# Patient Record
Sex: Male | Born: 1960 | Race: Asian | Hispanic: No | Marital: Married | Smoking: Never smoker
Health system: Southern US, Community
[De-identification: ages and names within clinical notes are randomized; demographics above are authoritative.]

## PROBLEM LIST (undated history)

## (undated) HISTORY — PX: EYE SURGERY: SHX253

---

## 2006-10-23 ENCOUNTER — Emergency Department (HOSPITAL_COMMUNITY): Admission: EM | Admit: 2006-10-23 | Discharge: 2006-10-23 | Payer: Self-pay | Admitting: Emergency Medicine

## 2007-04-02 ENCOUNTER — Encounter: Admission: RE | Admit: 2007-04-02 | Discharge: 2007-04-02 | Payer: Self-pay | Admitting: Family Medicine

## 2014-06-08 ENCOUNTER — Emergency Department (HOSPITAL_BASED_OUTPATIENT_CLINIC_OR_DEPARTMENT_OTHER)
Admission: EM | Admit: 2014-06-08 | Discharge: 2014-06-08 | Disposition: A | Discharge status: Home / Self Care | Payer: Managed Care, Other (non HMO) | Attending: Emergency Medicine | Admitting: Emergency Medicine

## 2014-06-08 ENCOUNTER — Encounter (HOSPITAL_BASED_OUTPATIENT_CLINIC_OR_DEPARTMENT_OTHER): Payer: Self-pay | Admitting: Emergency Medicine

## 2014-06-08 DIAGNOSIS — Y929 Unspecified place or not applicable: Secondary | ICD-10-CM | POA: Diagnosis not present

## 2014-06-08 DIAGNOSIS — T782XXA Anaphylactic shock, unspecified, initial encounter: Secondary | ICD-10-CM

## 2014-06-08 DIAGNOSIS — Y939 Activity, unspecified: Secondary | ICD-10-CM | POA: Insufficient documentation

## 2014-06-08 DIAGNOSIS — W57XXXA Bitten or stung by nonvenomous insect and other nonvenomous arthropods, initial encounter: Secondary | ICD-10-CM | POA: Diagnosis present

## 2014-06-08 DIAGNOSIS — T63461A Toxic effect of venom of wasps, accidental (unintentional), initial encounter: Secondary | ICD-10-CM | POA: Insufficient documentation

## 2014-06-08 DIAGNOSIS — T6391XA Toxic effect of contact with unspecified venomous animal, accidental (unintentional), initial encounter: Secondary | ICD-10-CM | POA: Insufficient documentation

## 2014-06-08 DIAGNOSIS — S1096XA Insect bite of unspecified part of neck, initial encounter: Secondary | ICD-10-CM | POA: Diagnosis present

## 2014-06-08 MED ORDER — EPINEPHRINE 0.3 MG/0.3ML IJ SOAJ: 0.3000 mg | Freq: Once | INTRAMUSCULAR | Status: AC

## 2014-06-08 MED ORDER — SODIUM CHLORIDE 0.9 % IV BOLUS (SEPSIS)
1000.0000 mL | Freq: Once | INTRAVENOUS | Status: AC
  Administered 2014-06-08: 1000 mL via INTRAVENOUS

## 2014-06-08 MED ORDER — PREDNISONE 20 MG PO TABS: 60.0000 mg | ORAL_TABLET | Freq: Every day | ORAL | Status: AC

## 2014-06-08 MED ORDER — EPINEPHRINE HCL 1 MG/ML IJ SOLN
INTRAMUSCULAR | Status: AC
  Administered 2014-06-08: 0.3 mg
  Filled 2014-06-08: qty 1

## 2014-06-08 MED ORDER — HYDROXYZINE HCL 50 MG/ML IM SOLN
25.0000 mg | Freq: Once | INTRAMUSCULAR | Status: DC
  Filled 2014-06-08: qty 0.5

## 2014-06-08 MED ORDER — HYDROXYZINE HCL 25 MG PO TABS
ORAL_TABLET | ORAL | Status: AC
  Filled 2014-06-08: qty 1

## 2014-06-08 MED ORDER — HYDROXYZINE HCL 25 MG PO TABS
25.0000 mg | ORAL_TABLET | Freq: Once | ORAL | Status: AC
  Administered 2014-06-08: 25 mg via ORAL

## 2014-06-08 MED ORDER — FAMOTIDINE 20 MG PO TABS: 20.0000 mg | ORAL_TABLET | Freq: Two times a day (BID) | ORAL | Status: AC

## 2014-06-08 MED ORDER — FAMOTIDINE 20 MG PO TABS
20.0000 mg | ORAL_TABLET | Freq: Once | ORAL | Status: AC
  Administered 2014-06-08: 20 mg via ORAL
  Filled 2014-06-08: qty 1

## 2014-06-08 MED ORDER — EPINEPHRINE 0.3 MG/0.3ML IJ SOAJ
0.3000 mg | Freq: Once | INTRAMUSCULAR | Status: DC
  Filled 2014-06-08: qty 0.6

## 2014-06-08 MED ORDER — DIPHENHYDRAMINE HCL 25 MG PO TABS: 50.0000 mg | ORAL_TABLET | Freq: Four times a day (QID) | ORAL | Status: AC

## 2014-06-08 MED ORDER — PREDNISONE 10 MG PO TABS
60.0000 mg | ORAL_TABLET | Freq: Once | ORAL | Status: AC
  Administered 2014-06-08: 60 mg via ORAL
  Filled 2014-06-08 (×2): qty 1

## 2014-06-08 NOTE — ED Notes (Signed)
Pt stated while taking pills that his throat was starting to feel tight. Dr Gwendolyn GrantWalden made aware.Marland Kitchen..Marland Kitchen

## 2014-06-08 NOTE — ED Provider Notes (Signed)
CSN: [id number]     Arrival date & time 06/08/14  1128 History   First MD Initiated Contact with Patient 06/08/14 1132     Chief Complaint  Patient presents with  . Insect Bite     (Consider location/radiation/quality/duration/timing/severity/associated sxs/prior Treatment) Patient is a 53 y.o. male presenting with animal bite. The history is provided by the patient.  Animal Bite Contact animal:  Insect Location:  Face Facial injury location:  Nose Time since incident:  1 hour Pain details:    Quality:  Aching   Severity:  No pain   Timing:  Constant   Progression:  Resolved Incident location:  Home Provoked: unprovoked   Animal in possession: no   Relieved by:  Nothing Ineffective treatments: benadyrl. Associated symptoms: rash (rash on legs, abdomen, chest)   Associated symptoms: no fever     No past medical history on file. No past surgical history on file. No family history on file. History  Substance Use Topics  . Smoking status: Not on file  . Smokeless tobacco: Not on file  . Alcohol Use: Not on file    Review of Systems  Constitutional: Negative for fever and chills.  Respiratory: Negative for cough and shortness of breath.   Gastrointestinal: Negative for vomiting.  Skin: Positive for rash (rash on legs, abdomen, chest).  All other systems reviewed and are negative.     Allergies  Review of patient's allergies indicates not on file.  Home Medications   Prior to Admission medications   Not on File   BP 122/83  Pulse 108  Temp(Src) 98 F (36.7 C) (Oral)  Resp 16  SpO2 95% Physical Exam  Nursing note and vitals reviewed. Constitutional: He is oriented to person, place, and time. He appears well-developed and well-nourished. No distress.  HENT:  Head: Normocephalic and atraumatic.  Mouth/Throat: Oropharynx is clear and moist. No oropharyngeal exudate.  Eyes: EOM are normal. Pupils are equal, round, and reactive to light.  Neck: Normal range  of motion. Neck supple.  Cardiovascular: Normal rate and regular rhythm.  Exam reveals no friction rub.   No murmur heard. Pulmonary/Chest: Effort normal and breath sounds normal. Stridor present. No respiratory distress. He has no wheezes. He has no rales.  Abdominal: He exhibits no distension. There is no tenderness. There is no rebound.  Musculoskeletal: Normal range of motion. He exhibits no edema.  Neurological: He is alert and oriented to person, place, and time.  Skin: Rash (hives on legs, chest, abdomen) noted. He is not diaphoretic.    ED Course  Procedures (including critical care time) Labs Review Labs Reviewed - No data to display  Imaging Review No results found.   EKG Interpretation None      MDM   Final diagnoses:  Anaphylaxis, initial encounter    60M stung by a wasp on bridge of nose. Began to have hives about 2-3 minutes afterwards. No CP, no SOB, no wheezing. Rash is itchy. Hx of hives with wasp stings 1 month ago, but not this fast. On arrival, vitals stable. Hives diffusely on abdomen, legs, chest. No wheezing, no stridor. Will give steroids. No indication for Epi at this time. When taking pills, was mildly hard to swallow, which is new. Will give EpiPen. After 2 hours, feeling much better, no return of symptoms. Given prednisone, benadryl, epipen Rx. Given resource guide to establish f/u.  Elwin MochaBlair Aliannah Holstrom, MD 06/08/14 1452

## 2014-06-08 NOTE — Discharge Instructions (Signed)
Anaphylactic Reaction °An anaphylactic reaction is a sudden, severe allergic reaction that involves the whole body. It can be life threatening. A hospital stay is often required. People with asthma, eczema, or hay fever are slightly more likely to have an anaphylactic reaction. °CAUSES  °An anaphylactic reaction may be caused by anything to which you are allergic. After being exposed to the allergic substance, your immune system becomes sensitized to it. When you are exposed to that allergic substance again, an allergic reaction can occur. Common causes of an anaphylactic reaction include: °· Medicines. °· Foods, especially peanuts, wheat, shellfish, milk, and eggs. °· Insect bites or stings. °· Blood products. °· Chemicals, such as dyes, latex, and contrast material used for imaging tests. °SYMPTOMS  °When an allergic reaction occurs, the body releases histamine and other substances. These substances cause symptoms such as tightening of the airway. Symptoms often develop within seconds or minutes of exposure. Symptoms may include: °· Skin rash or hives. °· Itching. °· Chest tightness. °· Swelling of the eyes, tongue, or lips. °· Trouble breathing or swallowing. °· Lightheadedness or fainting. °· Anxiety or confusion. °· Stomach pains, vomiting, or diarrhea. °· Nasal congestion. °· A fast or irregular heartbeat (palpitations). °DIAGNOSIS  °Diagnosis is based on your history of recent exposure to allergic substances, your symptoms, and a physical exam. Your caregiver may also perform blood or urine tests to confirm the diagnosis. °TREATMENT  °Epinephrine medicine is the main treatment for an anaphylactic reaction. Other medicines that may be used for treatment include antihistamines, steroids, and albuterol. In severe cases, fluids and medicine to support blood pressure may be given through an intravenous line (IV). Even if you improve after treatment, you need to be observed to make sure your condition does not get  worse. This may require a stay in the hospital. °HOME CARE INSTRUCTIONS  °· Wear a medical alert bracelet or necklace stating your allergy. °· You and your family must learn how to use an anaphylaxis kit or give an epinephrine injection to temporarily treat an emergency allergic reaction. Always carry your epinephrine injection or anaphylaxis kit with you. This can be lifesaving if you have a severe reaction. °· Do not drive or perform tasks after treatment until the medicines used to treat your reaction have worn off, or until your caregiver says it is okay. °· If you have hives or a rash: °¨ Take medicines as directed by your caregiver. °¨ You may use an over-the-counter antihistamine (diphenhydramine) as needed. °¨ Apply cold compresses to the skin or take baths in cool water. Avoid hot baths or showers. °SEEK MEDICAL CARE IF:  °· You develop symptoms of an allergic reaction to a new substance. Symptoms may start right away or minutes later. °· You develop a rash, hives, or itching. °· You develop new symptoms. °SEEK IMMEDIATE MEDICAL CARE IF:  °· You have swelling of the mouth, difficulty breathing, or wheezing. °· You have a tight feeling in your chest or throat. °· You develop hives, swelling, or itching all over your body. °· You develop severe vomiting or diarrhea. °· You feel faint or pass out. °This is an emergency. Use your epinephrine injection or anaphylaxis kit as you have been instructed. Call your local emergency services (911 in U.S[place] Even if you improve after the injection, you need to be examined at a hospital emergency department. °MAKE SURE YOU:  °· Understand these instructions. °· Will watch your condition. °· Will get help right away if you are not   doing well or get worse. Document Released: 10/17/2005 Document Revised: 10/22/2013 Document Reviewed: 01/18/2012 [facility] Patient Information 2015 [place], [place]. This information is not intended to replace advice given to you by your health  care provider. Make sure you discuss any questions you have with your health care provider.   Emergency Department Resource Guide 1) Find a Doctor and Pay Out of Pocket Although you won't have to find out who is covered by your insurance plan, it is a good idea to ask around and get recommendations. You will then need to call the office and see if the doctor you have chosen will accept you as a new patient and what types of options they offer for patients who are self-pay. Some doctors offer discounts or will set up payment plans for their patients who do not have insurance, but you will need to ask so you aren't surprised when you get to your appointment.  2) Contact Your Local Health Department Not all health departments have doctors that can see patients for sick visits, but many do, so it is worth a call to see if yours does. If you don't know where your local health department is, you can check in your phone book. The CDC also has a tool to help you locate your state's health department, and many state websites also have listings of all of their local health departments.  3) Find a [facility] If your illness is not likely to be very severe or complicated, you may want to try a walk in clinic. These are popping up all over the country in pharmacies, drugstores, and shopping centers. They're usually staffed by nurse practitioners or physician assistants that have been trained to treat common illnesses and complaints. They're usually fairly quick and inexpensive. However, if you have serious medical issues or chronic medical problems, these are probably not your best option.  No Primary Care Doctor: - Call Health Connect at  [phone] - they can help you locate a primary care doctor that  accepts your insurance, provides certain services, etc. - Physician Referral Service- [phone]  Chronic Pain Problems: Organization         Address  Phone   Notes  [facility]   [phone] Patients need to be referred by their primary care doctor.   Medication Assistance: Organization         Address  Phone   Notes  [facility] Medication [facility] [place] [address] [place], [place] [address] [phone] --Must be a resident of [facility] -- Must have NO insurance coverage whatsoever (no Medicaid/ Medicare, etc.) -- The pt. MUST have a primary care doctor that directs their care regularly and follows them in the community   Med[facility]ist  [phone]   [employer]  [phone]    Agencies that provide inexpensive medical care: Organization         Address  Phone   Notes  [place]  [phone]   [facility]    [phone]   [facility] [place], [place] [address] [phone]   [place] [address] [place] [phone]   Planned Parenthood    [phone]   [facility]    [phone]   [place] and [address], [place] Phone:  [phone], Fax:  [phone] Hours of Operation:  9  am - 6 pm, M-F.  Also accepts Medicaid/Medicare and self-pay.  [facility] for [address], [address], [place] Phone: [phone], Fax: [phone]. Hours of Operation:  8:30 am - 5:30 pm, M-F.  Also accepts Medicaid and self-pay.  [facility] [place] [address], [place] Phone: [phone]   [place], [place], [place] [phone], Ext. [phone] Mondays & Thursdays: 7-9 AM.  First 15 patients are seen on a first come, first serve basis.    [place] Providers:  Organization         Address  Phone   Notes  [facility] [address], [place] [phone] Also accepts self-pay patients.  [facility] [address], [place]  [phone]   [employer], [address], [place] [phone]   [facility] [address], [place] [phone]   Lucianne Lei [address], [place]   [phone] Only accepts [place] Access [place] patients after they have their name applied to their card.   Self-Pay (no insurance) in [facility]:  Organization         Address  Phone   Notes  Sickle Cell Patients, [facility] [place] [phone]   [facility] [place] [phone]   [facility] [place]  [place], [place], [place] [phone]   [facility]/Dr. Osei-Bonsu  [address] [place] or [address], [place] [phone] Phone number for both [place] and [place] locations is the same.  [facility] [address] [place] [phone]   [facility] [address] [place] or [address] [phone]   [facility] [address], [place] [phone], phone; [phone], fax Sees patients 1st and 3rd Saturday of every month.  Must not qualify for public or private insurance (i.e. Medicaid, Medicare, [place] Health Choice, Veterans' Benefits)  Household income should be no more than 200% of the poverty level The clinic cannot treat you if you are pregnant or think you are pregnant  Sexually transmitted diseases are not treated at the clinic.    Dental Care: Organization         Address  Phone  Notes  [facility] Department of [facility] [place] [phone] Accepts children up to age 28 who are enrolled in [place] or [place]; pregnant women with a Medicaid card; and children who have applied for Medicaid or [place] Health  Choice, but were declined, whose parents can pay a reduced fee at time of service.  [facility] Department of [facility]  [address], [place] [phone] Accepts children up to age 58 who are enrolled in [place] or [place]; pregnant women with a Medicaid card; and children who have applied for Medicaid or [place] Health Choice, but were declined, whose parents can pay a reduced fee at time of service.  [facility] Adult [facility] Access PROGRAM  [place] [phone] Patients are seen by appointment only. Walk-ins are not accepted. [place] will see patients 18 years of  age and older. Monday - Tuesday (8am-5pm) Most Wednesdays (8:30-5pm) $30 per visit, cash only  [facility] Adult Dental Access PROGRAM  [address], [facility] [phone] Patients are seen by appointment only. Walk-ins are not accepted. [place] will see patients 37 years of age and older. One Wednesday Evening (Monthly: Volunteer Based).  $30 per visit, cash only  [place]  [phone] for adults; Children under age 21, call Graduate Pediatric Dentistry at [phone]. Children aged 24-14, please call [phone] to request a pediatric application.  Dental services are provided in all areas of dental care including fillings, crowns and bridges, complete and partial dentures, implants, gum treatment, root canals, and extractions. Preventive care is also provided. Treatment is provided to both adults and children. Patients are selected via a lottery and there is often a waiting list.   [facility] [address] [place]  [phone] [email or web address]   [facility] [address] [place], [place] [phone], Ext. [phone] Second and Fourth Thursday of each month, opens at 6:30 AM; Clinic ends at 9 AM.  Patients are seen on a first-come first-served basis, and a limited number are seen during each  clinic.   [facility]  [address] Hillard Danker [place], [place] [phone]   Eligibility Requirements You must have lived in [place], [place], or [place] counties for at least the last three months.   You cannot be eligible for state or federal sponsored [employer], including [employer], [place], or [employer].   You generally cannot be eligible for healthcare insurance through your employer.    How to apply: Eligibility screenings are held every Tuesday and Wednesday afternoon from 1:00 pm until 4:00 pm. You do not need an appointment for the interview!  [facility] [address], [place], [place]   [place]  [place] Department  [place]  [phone]    Behavioral Health Resources in the Community: Intensive Outpatient Programs Organization         Address  Phone  Notes  [place]. [address] [place], [place] [phone]   [facility] Outpatient [address], [place], [place]   ADS: Alcohol & Drug Svcs [address] [place], [place]   [place] [address],  [place], [place] or [phone]   Substance Abuse Resources Organization         Address  Phone  Notes  Alcohol and Drug Services  [phone]   [place]  [phone]   [facility]   Chinita Pester  [phone]   Residential & Outpatient Substance Abuse Program  [phone]   Psychological Services Organization         Address  Phone  Notes  [facility] [place]  [place]  [phone]   [place] [address], [place] or [phone]    Mobile Crisis Teams Organization         Address  Phone  Notes  [facility] Alternatives, Mobile  Crisis Care Unit  [phone]   [facility]  [address] [place], [place]   [facility] [address] [place] [phone]    Self-Help/Support Groups Organization  Address  Phone             Notes  [place]. of [place] - variety of support groups  [place] Call for more information  Narcotics Anonymous (NA), [facility] [address], [employer] [place]  2 meetings at this location   Special educational needs teacher         Address  Phone  Notes  ASAP Residential Treatment [place],    [place]  [phone]   [facility]  [address] [place] [phone], [place], [place]   [place], [place] [phone] Admissions: 8am-3pm M-F  Incentives Substance [place] [address],    [place], [place] [phone]   [facility] [address] [place], [place], [place]   [facility] [address]  [place], [place]   [facility]ight Programs - Intensive Outpatient [address], [place], [place]   [facility] ([place].) [place].,  [place], [place] [phone] or [phone]   [facility] (RTS) [address] [place], [place] Accepts Medicaid  [facility] [place] [address]  [place] [phone] Substance Abuse/Addiction Treatment   [facility] Organization         Address  Phone  Notes  [facility]  [phone]   Domenic Schwab, PhD [address] [place]lis [place], [place]   [phone] or [phone]   [place], [place] [phone]   [facility] [address], [place], [place] [phone] Insurance/Medicaid/sponsorship through [facility] and Families [address], [address]                                    [place], [place] [phone] [place] [address], [place] [phone]    Dr. Adele Schilder  [phone]   [facility] Dept. 1) [address], [place] 2) [place] 3)  [place] [address], [place] [phone]  [phone]   [place] [phone] or [phone] (After Hours)

## 2014-06-08 NOTE — ED Notes (Signed)
Pt presents to ED via EMS after being stung by wasp on the face on the nose near the eye, Pt took 50 mg po benadryl due to hives on legs. EMS placed 18g to left forearm. Sting happened at around 1030 this am. Hives noted but no shortness of breath.

## 2014-06-08 NOTE — ED Notes (Signed)
Pt discharged to home with family. NAD.  

## 2015-10-01 ENCOUNTER — Encounter (HOSPITAL_BASED_OUTPATIENT_CLINIC_OR_DEPARTMENT_OTHER): Payer: Self-pay | Admitting: *Deleted

## 2015-10-01 ENCOUNTER — Emergency Department (HOSPITAL_BASED_OUTPATIENT_CLINIC_OR_DEPARTMENT_OTHER)
Admission: EM | Admit: 2015-10-01 | Discharge: 2015-10-01 | Disposition: A | Discharge status: Home / Self Care | Payer: Managed Care, Other (non HMO) | Attending: Emergency Medicine | Admitting: Emergency Medicine

## 2015-10-01 ENCOUNTER — Emergency Department (HOSPITAL_BASED_OUTPATIENT_CLINIC_OR_DEPARTMENT_OTHER): Payer: Managed Care, Other (non HMO)

## 2015-10-01 DIAGNOSIS — Z79899 Other long term (current) drug therapy: Secondary | ICD-10-CM | POA: Insufficient documentation

## 2015-10-01 DIAGNOSIS — S4991XA Unspecified injury of right shoulder and upper arm, initial encounter: Secondary | ICD-10-CM | POA: Insufficient documentation

## 2015-10-01 DIAGNOSIS — S4992XA Unspecified injury of left shoulder and upper arm, initial encounter: Secondary | ICD-10-CM | POA: Insufficient documentation

## 2015-10-01 DIAGNOSIS — S3992XA Unspecified injury of lower back, initial encounter: Secondary | ICD-10-CM | POA: Insufficient documentation

## 2015-10-01 DIAGNOSIS — S8992XA Unspecified injury of left lower leg, initial encounter: Secondary | ICD-10-CM | POA: Diagnosis not present

## 2015-10-01 DIAGNOSIS — Y9389 Activity, other specified: Secondary | ICD-10-CM | POA: Insufficient documentation

## 2015-10-01 DIAGNOSIS — Y92410 Unspecified street and highway as the place of occurrence of the external cause: Secondary | ICD-10-CM | POA: Diagnosis not present

## 2015-10-01 DIAGNOSIS — Y998 Other external cause status: Secondary | ICD-10-CM | POA: Diagnosis not present

## 2015-10-01 DIAGNOSIS — Z7952 Long term (current) use of systemic steroids: Secondary | ICD-10-CM | POA: Diagnosis not present

## 2015-10-01 DIAGNOSIS — S199XXA Unspecified injury of neck, initial encounter: Secondary | ICD-10-CM | POA: Insufficient documentation

## 2015-10-01 MED ORDER — DIAZEPAM 5 MG PO TABS: 5.0000 mg | ORAL_TABLET | Freq: Four times a day (QID) | ORAL | Status: AC | PRN

## 2015-10-01 MED ORDER — IBUPROFEN 600 MG PO TABS: 600.0000 mg | ORAL_TABLET | Freq: Four times a day (QID) | ORAL | Status: AC | PRN

## 2015-10-01 MED ORDER — IBUPROFEN 800 MG PO TABS
800.0000 mg | ORAL_TABLET | Freq: Once | ORAL | Status: AC
  Administered 2015-10-01: 800 mg via ORAL
  Filled 2015-10-01: qty 1

## 2015-10-01 NOTE — ED Notes (Signed)
Pa  at bedside. 
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
## 2015-10-01 NOTE — Discharge Instructions (Signed)
You were seen today for your neck pain, shoulder pain, back pain and knee pain.  Your neck pain is likely secondary to muscle spasm abut if it persists you should have an MRI done outpatient this can be ordered by your primary care physician.  You should follow up with the orthopedist whose information is provided regarding your knee pain.   Muscle Cramps and Spasms Muscle cramps and spasms occur when a muscle or muscles tighten and you have no control over this tightening (involuntary muscle contraction). They are a common problem and can develop in any muscle. The most common place is in the calf muscles of the leg. Both muscle cramps and muscle spasms are involuntary muscle contractions, but they also have differences:   Muscle cramps are sporadic and painful. They may last a few seconds to a quarter of an hour. Muscle cramps are often more forceful and last longer than muscle spasms.  Muscle spasms may or may not be painful. They may also last just a few seconds or much longer. CAUSES  It is uncommon for cramps or spasms to be due to a serious underlying problem. In many cases, the cause of cramps or spasms is unknown. Some common causes are:   Overexertion.   Overuse from repetitive motions (doing the same thing over and over).   Remaining in a certain position for a long period of time.   Improper preparation, form, or technique while performing a sport or activity.   Dehydration.   Injury.   Side effects of some medicines.   Abnormally low levels of the salts and ions in your blood (electrolytes), especially potassium and calcium. This could happen if you are taking water pills (diuretics) or you are pregnant.  Some underlying medical problems can make it more likely to develop cramps or spasms. These include, but are not limited to:   Diabetes.   Parkinson disease.   Hormone disorders, such as thyroid problems.   Alcohol abuse.   Diseases specific to muscles,  joints, and bones.   Blood vessel disease where not enough blood is getting to the muscles.  HOME CARE INSTRUCTIONS   Stay well hydrated. Drink enough water and fluids to keep your urine clear or pale yellow.  It may be helpful to massage, stretch, and relax the affected muscle.  For tight or tense muscles, use a warm towel, heating pad, or hot shower water directed to the affected area.  If you are sore or have pain after a cramp or spasm, applying ice to the affected area may relieve discomfort.  Put ice in a plastic bag.  Place a towel between your skin and the bag.  Leave the ice on for 15-20 minutes, 03-04 times a day.  Medicines used to treat a known cause of cramps or spasms may help reduce their frequency or severity. Only take over-the-counter or prescription medicines as directed by your caregiver. SEEK MEDICAL CARE IF:  Your cramps or spasms get more severe, more frequent, or do not improve over time.  MAKE SURE YOU:   Understand these instructions.  Will watch your condition.  Will get help right away if you are not doing well or get worse.   This information is not intended to replace advice given to you by your health care provider. Make sure you discuss any questions you have with your health care provider.   Document Released: 04/08/2002 Document Revised: 02/11/2013 Document Reviewed: 10/03/2012 Elsevier Interactive Patient Education 2016 [employer]  Back  Pain, Adult Back pain is very common in adults.The cause of back pain is rarely dangerous and the pain often gets better over time.The cause of your back pain may not be known. Some common causes of back pain include:  Strain of the muscles or ligaments supporting the spine.  Wear and tear (degeneration) of the spinal disks.  Arthritis.  Direct injury to the back. For many people, back pain may return. Since back pain is rarely dangerous, most people can learn to manage this condition on their  own. HOME CARE INSTRUCTIONS Watch your back pain for any changes. The following actions may help to lessen any discomfort you are feeling:  Remain active. It is stressful on your back to sit or stand in one place for long periods of time. Do not sit, drive, or stand in one place for more than 30 minutes at a time. Take short walks on even surfaces as soon as you are able.Try to increase the length of time you walk each day.  Exercise regularly as directed by your health care provider. Exercise helps your back heal faster. It also helps avoid future injury by keeping your muscles strong and flexible.  Do not stay in bed.Resting more than 1-2 days can delay your recovery.  Pay attention to your body when you bend and lift. The most comfortable positions are those that put less stress on your recovering back. Always use proper lifting techniques, including:  Bending your knees.  Keeping the load close to your body.  Avoiding twisting.  Find a comfortable position to sleep. Use a firm mattress and lie on your side with your knees slightly bent. If you lie on your back, put a pillow under your knees.  Avoid feeling anxious or stressed.Stress increases muscle tension and can worsen back pain.It is important to recognize when you are anxious or stressed and learn ways to manage it, such as with exercise.  Take medicines only as directed by your health care provider. Over-the-counter medicines to reduce pain and inflammation are often the most helpful.Your health care provider may prescribe muscle relaxant drugs.These medicines help dull your pain so you can more quickly return to your normal activities and healthy exercise.  Apply ice to the injured area:  Put ice in a plastic bag.  Place a towel between your skin and the bag.  Leave the ice on for 20 minutes, 2-3 times a day for the first 2-3 days. After that, ice and heat may be alternated to reduce pain and spasms.  Maintain a  healthy weight. Excess weight puts extra stress on your back and makes it difficult to maintain good posture. SEEK MEDICAL CARE IF:  You have pain that is not relieved with rest or medicine.  You have increasing pain going down into the legs or buttocks.  You have pain that does not improve in one week.  You have night pain.  You lose weight.  You have a fever or chills. SEEK IMMEDIATE MEDICAL CARE IF:   You develop new bowel or bladder control problems.  You have unusual weakness or numbness in your arms or legs.  You develop nausea or vomiting.  You develop abdominal pain.  You feel faint.   This information is not intended to replace advice given to you by your health care provider. Make sure you discuss any questions you have with your health care provider.   Document Released: 10/17/2005 Document Revised: 11/07/2014 Document Reviewed: 02/18/2014 Elsevier Interactive Patient Education 2016 [employer]  Inc[employer]
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
